# Patient Record
Sex: Male | Born: 1975 | Race: White | Hispanic: Yes | Marital: Single | Smoking: Current every day smoker
Health system: Southern US, Community
[De-identification: ages and names within clinical notes are randomized; demographics above are authoritative.]

---

## 2009-03-05 ENCOUNTER — Emergency Department (HOSPITAL_COMMUNITY): Admission: EM | Admit: 2009-03-05 | Discharge: 2009-03-05 | Payer: Self-pay | Admitting: Emergency Medicine

## 2016-04-27 ENCOUNTER — Ambulatory Visit (INDEPENDENT_AMBULATORY_CARE_PROVIDER_SITE_OTHER): Payer: Self-pay | Admitting: Urgent Care

## 2016-04-27 ENCOUNTER — Ambulatory Visit (INDEPENDENT_AMBULATORY_CARE_PROVIDER_SITE_OTHER): Payer: Self-pay

## 2016-04-27 VITALS — BP 122/72 | HR 79 | Temp 98.1°F | Resp 17 | Ht 68.5 in | Wt 155.0 lb

## 2016-04-27 DIAGNOSIS — M545 Low back pain, unspecified: Secondary | ICD-10-CM

## 2016-04-27 DIAGNOSIS — M546 Pain in thoracic spine: Secondary | ICD-10-CM

## 2016-04-27 DIAGNOSIS — M5137 Other intervertebral disc degeneration, lumbosacral region: Secondary | ICD-10-CM

## 2016-04-27 DIAGNOSIS — M5030 Other cervical disc degeneration, unspecified cervical region: Secondary | ICD-10-CM

## 2016-04-27 DIAGNOSIS — M5136 Other intervertebral disc degeneration, lumbar region: Secondary | ICD-10-CM

## 2016-04-27 DIAGNOSIS — M542 Cervicalgia: Secondary | ICD-10-CM

## 2016-04-27 DIAGNOSIS — Z026 Encounter for examination for insurance purposes: Secondary | ICD-10-CM

## 2016-04-27 MED ORDER — CYCLOBENZAPRINE HCL 5 MG PO TABS: 5.0000 mg | ORAL_TABLET | Freq: Three times a day (TID) | ORAL | 1 refills | Status: AC | PRN

## 2016-04-27 MED ORDER — PREDNISONE 20 MG PO TABS: 40.0000 mg | ORAL_TABLET | Freq: Every day | ORAL | 0 refills | Status: DC

## 2016-04-27 NOTE — Patient Instructions (Addendum)
Discopata degenerativa (Degenerative Disk Disease) La discopata degenerativa es una enfermedad causada por los cambios que se producen en los discos intervertebrales a medida que una persona envejece. Los discos intervertebrales son blandos y comprimibles, y se encuentran entre los huesos de la columna (vrtebras). Estos discos actan como amortiguadores de los [place]. La discopata degenerativa puede comprometer a toda la columna vertebral. Sin embargo, el cuello y parte inferior de la espalda son las zonas ms comnmente afectadas. Al envejecer, pueden producirse muchos cambios en los discos intervertebrales, por ejemplo:  Pueden secarse y encogerse.  Pueden formarse pequeos desgarros en el recubrimiento exterior duro del disco (anillo).  El espacio intervertebral puede reducirse debido a la prdida de [place].  Pueden desarrollarse crecimientos anormales en el hueso (espolones). Esto puede ejercer presin [employer] races nerviosas que salen del canal espinal y Engineer, production.  El canal espinal puede estrecharse. FACTORES DE RIESGO   Tener sobrepeso.  Tener antecedentes familiares de discopata degenerativa.  Fumar.  El riesgo es mayor si suele levantar objetos pesados o sufre una lesin repentina. [place] persona a otra y pueden incluir los siguientes:  Dolor de intensidad variable. Algunas personas no tienen dolor, mientras que otras sufren un dolor intenso. La ubicacin del dolor depende de la zona de la columna vertebral que est afectada.  Si se trata de un disco que se encuentra cerca de la zona del cuello, tendr dolor de cuello o del brazo.  Si la zona afectada es la parte inferior de la espalda, tendr dolor de [place], de los glteos o las piernas.  Dolor que empeora al agacharse, [employer] brazos o Optometrist movimientos de torsin.  Dolor que puede comenzar de [place] gradual y Film/video editor con el paso del [place]. Tambin puede  aparecer despus de sufrir una lesin leve o importante.  Hormigueo o adormecimiento de los brazos o las piernas. DIAGNSTICO  El mdico le preguntar cules son sus sntomas y Estate manager/land agent las actividades y los hbitos que pueden causar Conservation officer, historic buildings. [place], [employer] y las enfermedades que tuvo o los tratamientos que recibi. El [employer] har un examen para determinar el rango de movimiento posible de la zona afectada, controlar la fuerza que tiene en las extremidades, as Therapist, occupational sensibilidad en las zonas de los brazos y las piernas inervadas por diferentes races nerviosas. Tambin se Educational psychologist los siguientes estudios:   Una radiografa de la columna.  Otros estudios por imgenes, como una RM. TRATAMIENTO  El Sport and exercise psychologist cul es el mejor plan de tratamiento. El tratamiento puede incluir lo siguiente:  Medicamentos.  Ejercicios de rehabilitacin. INSTRUCCIONES PARA EL CUIDADO EN EL HOGAR   Siga las tcnicas adecuadas para caminar y Lexicographer objetos pesados como se lo haya recomendado el mdico.  Mantenga una buena [place].  Haga ejercicios regularmente como se lo haya recomendado el [place] de relajacin.  Cambie los hbitos para sentarse, ponerse de pie y dormir como se lo haya recomendado el mdico.  Cambie frecuentemente de posicin.  Mantenga un peso saludable o baje de peso como se lo haya recomendado el mdico.  No consuma ningn producto que contenga tabaco, lo que incluye cigarrillos, tabaco de Higher education careers adviser o Psychologist, sport and exercise. Si necesita ayuda para dejar de fumar, consulte al mdico.  Use calzado que tenga el soporte correcto.  Tome los medicamentos solamente como se lo haya indicado el mdico. SOLICITE ATENCIN MDICA SI:   El dolor no desaparece  en el trmino de 1 a 4semanas.  Pierde drsticamente de peso o el apetito. SOLICITE ATENCIN MDICA DE INMEDIATO SI:   El dolor es intenso.  Tiene Colgatedebilidad en los brazos, las  manos o las piernas.  Comienza a perder el control de la vejiga o los intestinos.  Tiene fiebre o sudores nocturnos. ASEGRESE DE QUE:   Comprende estas instrucciones.  Controlar su afeccin.  Recibir ayuda de inmediato si no mejora o si empeora.   Esta informacin no tiene Theme park managercomo fin reemplazar el consejo del mdico. Asegrese de hacerle al mdico cualquier pregunta que tenga.   Document Released: 10/22/2008 Document Revised: 07/27/2014 Elsevier Interactive Patient Education 2016 [employer]    Dolor de espalda en adultos (Back Pain, Adult) El dolor de espalda es muy frecuente en los adultos.La causa del dolor de espalda es rara vez peligrosa y Chief Technology Officerel dolor a menudo mejora con el [place] Citytiempo.Es posible que se desconozca la causa de esta afeccin. Algunas causas comunes son las siguientes:  Distensin de los msculos o ligamentos que sostienen la columna vertebral.  ChiropractorDesgaste (degeneracin) de los discos vertebrales.  Artritis.  Lesiones directas en la espalda. En [place] personas, el dolor de espalda es recurrente. Como rara vez es peligroso, las personas pueden aprender a Psychologist, clinicalmanejar esta afeccin por s mismas. INSTRUCCIONES PARA EL CUIDADO EN EL HOGAR Controle su dolor de espalda a fin de Public house managerdetectar algn cambio. Las siguientes indicaciones ayudarn a Architectural technologistaliviar cualquier molestia que pueda sentir:  Medical illustratorermanezca activo. Si permanece sentado o de pie en un mismo lugar durante mucho tiempo, se tensiona la espalda. No se siente, conduzca o permanezca de pie en un mismo lugar durante ms de 30 minutos seguidos. Realice caminatas cortas en superficies planas tan pronto como le sea posible.Trate de caminar un poco ms de Pharmacist, communitytiempo cada da.  Haga ejercicio regularmente como se lo haya indicado el mdico. El ejercicio ayuda a que su espalda se cure ms rpidamente. Tambin ayuda a prevenir futuras lesiones al Kimberly-Clarkmantener los msculos fuertes y flexibles.  No permanezca en la cama.Si hace reposo ms de  1 a 2 das, puede demorar su recuperacin.  Preste atencin a su cuerpo al inclinarse y levantarse. Las posiciones ms cmodas son las que ejercen menos tensin en la espalda en recuperacin. Siempre use tcnicas apropiadas para levantar objetos, como por ejemplo:  Flexionar las rodillas.  Mantener la carga cerca del cuerpo.  No torcerse.  Encuentre una posicin cmoda para dormir. Use un colchn firme y recustese de costado con las rodillas ligeramente flexionadas. Si se recuesta Fisher Scientificsobre la espalda, coloque una almohada debajo de las rodillas.  Evite sentir ansiedad o estrs.El estrs aumenta la tensin muscular y puede empeorar el dolor de espalda.Es importante reconocer si se siente ansioso o estresado y aprender maneras de controlarlo, por ejemplo haciendo ejercicio.  Tome los medicamentos solamente como se lo haya indicado el mdico. Los medicamentos de venta libre para Engineer, materialsaliviar el dolor y la inflamacin a menudo son los ms eficaces.El mdico puede recetarle relajantes musculares.Estos medicamentos ayudan a Primary school teachercalmar el dolor de modo que pueda reanudar ms rpidamente sus actividades normales y el ejercicio saludable.  Aplique hielo sobre la zona lesionada.  Ponga el hielo en una bolsa plstica.  Coloque una toalla entre la piel y la bolsa de hielo.  Deje el hielo durante 20minutos, 2 a 3veces por da, durante los primeros 2 o 3das. Despus de eso, puede alternar el hielo y el calor para reducir Chief Technology Officerel dolor y los espasmos.  Mantenga  un peso saludable. El exceso de peso ejerce presin adicional sobre la espalda y hace que resulte difcil mantener una buena [place]. SOLICITE ATENCIN MDICA SI:  Siente un dolor que no se alivia con reposo o medicamentos.  Siente mucho dolor que se extiende a las piernas o los glteos.  El dolor no mejora en una semana.  Siente dolor por la noche.  Pierde peso.  Siente escalofros o fiebre. SOLICITE ATENCIN MDICA DE INMEDIATO SI:   Tiene  nuevos problemas para controlar la vejiga o los intestinos.  Siente debilidad o adormecimiento inusuales en los brazos o en las piernas.  Siente nuseas o vmitos.  Siente dolor abdominal.  Siente que va a desmayarse.   Esta informacin no tiene Theme park manager el consejo del mdico. Asegrese de hacerle al mdico cualquier pregunta que tenga.   Document Released: 07/06/2005 Document Revised: 07/27/2014 Elsevier Interactive Patient Education [employer]     IF you received an x-ray today, you will receive an invoice from [facility]. Please contact [facility] at [phone] with questions or concerns regarding your invoice.   IF you received labwork today, you will receive an invoice from [employer]. Please contact [facility] at [phone] with questions or concerns regarding your invoice.   Our billing staff will not be able to assist you with questions regarding bills from these companies.  You will be contacted with the lab results as soon as they are available. The fastest way to get your results is to activate your My Chart account. Instructions are located on the last page of this paperwork. If you have not heard from [place] regarding the results in 2 weeks, please contact this office.

## 2016-04-27 NOTE — Progress Notes (Addendum)
Patient ID: Kurt Rhodes, male   DOB: 22-Apr-1976, 40 y.o.   MRN: [id number]   By signing my name below, I, Essence Howell, attest that this documentation has been prepared under the direction and in the presence of Wallis BambergMario Mani, PA-C Electronically Signed: Charline BillsEssence Howell, ED Scribe 04/27/2016 at 10:31 AM.  MRN: [id number] DOB: 22-Apr-1976  Subjective:   Kurt Rhodes is a 40 y.o. 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 States that when he tried to lift them, felt immediate sharp pain in his low back, has been radiating to his left leg. He has not been working since 04/22/2016. Has tried naproxen 400mg  BID with some relief. Admits that with resting and naproxen, his pain has been steady but not improving. Denies saddle paraesthesia, hematuria, weakness. He does get some numbness and tingling of his left buttock with prolonged sitting.  Patient's current medications, allergies, pmh, psh were reviewed and not included due to being a worker's comp case.   Objective:   Vitals: BP 122/72 (BP Location: Right Arm, Patient Position: Sitting, Cuff Size: Normal)    Pulse 79    Temp 98.1 F (36.7 C) (Oral)    Resp 17    Ht 5' 8.5" (1.74 m)    Wt 155 lb (70.3 kg)    SpO2 97%    BMI 23.22 kg/m   Physical Exam  Constitutional: He is oriented to person, place, and time. He appears well-developed and well-nourished.  Cardiovascular: Normal rate.   Pulmonary/Chest: Effort normal.  Musculoskeletal:       Cervical back: He exhibits decreased range of motion (flexion, extension). He exhibits no tenderness, no bony tenderness, no swelling, no edema, no deformity, no laceration and no spasm.       Thoracic back: He exhibits tenderness (left paraspinal muscles). He exhibits normal range of motion, no bony tenderness, no swelling, no edema, no deformity and no spasm.       Lumbar back: He exhibits  decreased range of motion (flexion, extension, rotation), tenderness (mid-left sided) and spasm. He exhibits no bony tenderness, no swelling, no edema and no deformity.  Negative straight leg raise.  Neurological: He is alert and oriented to person, place, and time.   Dg Cervical Spine Complete  Result Date: 04/27/2016 CLINICAL DATA:  Neck pain EXAM: CERVICAL SPINE - COMPLETE 4+ VIEW COMPARISON:  None FINDINGS: Straightening of normal cervical lordosis. There is no underlying fracture. Degenerative disc disease is noted at C4-5, C5-6 and C6-7. The facet joints are all well aligned. IMPRESSION: 1. Straightening of normal cervical lordosis is identified which may reflect muscle spasm or patient positioning. 2. Multi level degenerative disc disease. Electronically Signed   By: Signa Kellaylor  Stroud M.D.   On: 04/27/2016 11:46   Dg Thoracic Spine 2 View  Result Date: 04/27/2016 CLINICAL DATA:  Upper back pain EXAM: THORACIC SPINE 2 VIEWS COMPARISON:  None FINDINGS: Normal alignment of the thoracic spine. The vertebral body heights and the disc spaces are well preserved. No fracture or subluxation identified. Mild degenerative disc disease identified. IMPRESSION: 1. No acute findings. 2. Mild degenerative disc disease. Electronically Signed   By: Signa Kellaylor  Stroud M.D.   On: 04/27/2016 11:49   Dg Lumbar Spine Complete  Result Date: 04/27/2016 CLINICAL DATA:  Back pain as a result of lifting breaks at work. Feels sharp pain in lower back lifting while lifting with pain radiating to the  left leg. EXAM: LUMBAR SPINE - COMPLETE 4+ VIEW COMPARISON:  Thoracic spine 04/27/2016 FINDINGS: Minimal retrolisthesis at L4-L5. Disc space narrowing at L5-S1. The vertebral body heights are maintained. No evidence for fracture. No evidence for pars defect. Mild degenerative endplate changes. IMPRESSION: Mild disc space narrowing at L5-S1. Electronically Signed   By: Richarda Overlie M.D.   On: 04/27/2016 11:47     Assessment and Plan :     1. Encounter related to worker's compensation claim 2. Acute left-sided low back pain without sciatica 3. Acute right-sided thoracic back pain 4. Neck pain 5. Degenerative disc disease at L5-S1 level 6. Degenerative disc disease, cervical - Patient likely experienced back sprain while at work, worsened by his multi-level DDD. He is to start short steroid course, Flexeril. Work restrictions provided, rtc in 1 week for recheck.  Wallis Bamberg, PA-C [facility] [phone] 04/27/2016 10:31 AM

## 2016-05-06 ENCOUNTER — Ambulatory Visit (INDEPENDENT_AMBULATORY_CARE_PROVIDER_SITE_OTHER): Payer: Self-pay | Admitting: Physician Assistant

## 2016-05-06 ENCOUNTER — Encounter: Payer: Self-pay | Admitting: Physician Assistant

## 2016-05-06 VITALS — BP 110/70 | HR 77 | Temp 97.9°F | Resp 16 | Ht 68.5 in | Wt 160.2 lb

## 2016-05-06 DIAGNOSIS — Z23 Encounter for immunization: Secondary | ICD-10-CM

## 2016-05-06 DIAGNOSIS — Z042 Encounter for examination and observation following work accident: Secondary | ICD-10-CM

## 2016-05-06 DIAGNOSIS — M545 Low back pain, unspecified: Secondary | ICD-10-CM

## 2016-05-06 NOTE — Patient Instructions (Addendum)
Patient is to perform exercises below at 5 sets with 10 repetitions. Stretches are to be performed for 5 sets, 10 seconds each. Recommended she perform this rehab twice daily within pain tolerance for 2 weeks.  Heat, gentle massage and gentle stretching can help. Remain active, as inactivity can cause more pain. Don't do anything too strenuous though. If you develop new numbness, weakness or incontinence of urine or bowel, return to clinic or go to the emergency room ASAP.  Please return in one week for follow-up.   IF you received an x-ray today, you will receive an invoice from [facility]. Please contact [facility] at [phone] with questions or concerns regarding your invoice.   IF you received labwork today, you will receive an invoice from [employer]. Please contact [facility] at [phone] with questions or concerns regarding your invoice.   Our billing staff will not be able to assist you with questions regarding bills from these companies.  You will be contacted with the lab results as soon as they are available. The fastest way to get your results is to activate your My Chart account. Instructions are located on the last page of this paperwork. If you have not heard from [place] regarding the results in 2 weeks, please contact this office.     Distensin de la cintura con rehabilitacin (Low Back Strain With Rehab) Neomia Dear distensin es una lesin en la que el tendn o msculo se rasga. Los msculos y tendones de la cintura son susceptibles de sufrir distensiones. Sin embargo, estos msculos y tendones son Lynnae Sandhoff fuertes y se requiere de una gran fuerza para lesionarlos. Los esguinces se clasifican en tres categoras. En el esguince de grado 1 hay dolor, pero el tendn no est estirado. En los esguinces de grado 2 hay un ligamento alargado, debido a un estiramiento o desgarro parcial. En el esguince de grado 2 an hay funcionamiento, aunque ste  puede disminuir. Una distensin en grado 3 es la ruptura completa del msculo o el tendn, y suele quedar incapacitada la funcin. SNTOMAS  Dolor en la cintura.  Dolor en la espalda, que a menudo afecta ms a un lado que al [employer]IG[employer].  Dolor que empeora con los movimientos y se siente en las caderas, las nalgas o la zona posterior del muslo.  Espasmos en los msculos de la espalda.  Hinchazn a lo largo de los msculos de la espalda.  Prdida de la fuerza en estos msculos.  Ruido de "crack" (crepitacin) al tocarlos. CAUSAS  La distensin se produce cuando se coloca una fuerza en el msculo o tendn que es mayor de lo que puede soportar. Las causas ms frecuentes de la lesin son:  Benedetto Goad prolongado de la unidad msculo - tendn en la zona de la cintura, generalmente debido a Landscape architect.  Una nica lesin o fuerza violenta aplicada en la espalda. LOS RIESGOS AUMENTAN CON  Cualquier deporte en el que el movimiento ocasione una fuerza de giro en la espalda o una gran inclinacin de la cintura; (ftbol americano, rugby, levantamiento de pesas, bowling, golf, tenis, patinaje, deportes con raqueta, natacin, carrera, gimnasia, clavados).  Poca fuerza y flexibilidad.  No hacer un precalentamiento adecuado.  Historia familiar de dolor de cintura o trastornos en discos.  Cirugas previas en la espalda (especialmente fusin).  Tcnica incorrecta al levantar objetos pesados.  Permanecer largo tiempo sentado, especialmente de manera incorrecta. MEDIDAS DE PREVENCIN  Aprenda y utilice la mecnica correcta al sentarse o al levantarse (  mantener la postura correcta al sentarse; levantar objetos inclinando las rodillas y las piernas y no la cintura).  Precalentamiento adecuado y elongacin antes de la Oakleyactividad.  Descanso y recuperacin entre actividades.  Mantener la forma fsica:  Earma ReadingFuerza, flexibilidad y resistencia muscular.  Capacidad  cardiovascular. PRONSTICO Si se trata adecuadamente, generalmente es curable dentro de las 6 semanas. POSIBLES COMPLICACIONES:  La recurrencia frecuente de los sntomas puede dar como resultado un problema crnico.  La inflamacin crnica, degeneracin cicatrizal del tendn y ruptura parcial del tendn.  Demora de la curacin o de la resolucin de los sntomas.  Discapacidad prolongada. CONSIDERACIONES CSX CorporationENERALES PARA EL TRATAMIENTO El tratamiento inicial incluye el uso de medicamentos y la aplicacin de hielo para reducir Chief Technology Officerel dolor y la inflamacin. Los ejercicios de elongacin y fortalecimiento pueden ayudar a reducir Chief Technology Officerel dolor con la Gothaactividad. Los ejercicios pueden Management consultantrealizarse en el hogar o con un terapeuta. Los Liz Claibornecasos graves pueden requerir la derivacin a un fisioterapeuta para Magazine features editorrealizar una evaluacin y Games developercomenzar un tratamiento, como ultrasonido. El profesional podr indicarle el uso de un dispositivo ortopdico para ayudar a Glass blower/designerreducir el dolor y la inflamacin. A menudo, demasiado reposo en cama podr resultar en ms daos que beneficios. Podrn prescribirle inyecciones de corticoides. Sin embargo, esto deber reservarse para los casos ms graves. Es Therapist, occupationalimportante evitar el uso de la espalda cuando se levantan objetos. Por la noche, se aconseja que usted Djiboutiduerma boca arriba, sobre un [address]col[address] firme y coloque una almohada debajo de las rodillas. Si no se obtiene xito con Artistel tratamiento conservador, ser necesario someterse a [place] and Herz[place]ovinauna ciruga.  MEDICAMENTOS   Si es necesaria la administracin de medicamentos para Chief Technology Officerel dolor, se recomiendan los antiinflamatorios no esteroides, como aspirina e ibuprofeno y otros calmantes menores, como acetaminofeno.  No tome medicamentos para el dolor dentro de los [address] das previos a la Azerbaijanciruga.  El profesional podr prescribirle calmantes si lo considera necesario. Utilcelos como se le indique y slo cuando lo necesite.  Podr beneficiarse con Teachers Insurance and Annuity Associationpomadas sobre la piel.  En  algunos casos se indica una inyeccin de corticosteroides. Estas inyecciones deben reservarse para los New Brendacasos graves, porque slo se pueden administrar una determinada cantidad de veces. CALOR Y FRO   El fro (con hielo) debe aplicarse durante 10 a 15 minutos cada 2  3 horas para reducir la inflamacin y Chief Technology Officerel dolor e inmediatamente despus de cualquier actividad que agrava los sntomas. Utilice bolsas o un masaje de hielo.  El calor puede usarse antes de Therapist, musicelongar y de las actividades de fortalecimiento indicadas por el profesional, le fisioterapeuta o Orthoptistel entrenador. Utilice una bolsa trmica o un pao hmedo. SOLICITE ATENCIN MDICA SI:   Los sntomas empeoran o no mejoran en 2 a 4 semanas, an realizando Pharmacist, communityun tratamiento.  Siente adormecimiento, debilitamiento o prdida de control de la vejiga o el intestino.  Desarrolla nuevos e inexplicables sntomas. (Los medicamentos indicados en el tratamiento le ocasionan efectos secundarios). EJERCICIOS  EJERCICIOS DE AMPLITUD DE MOVIMIENTOS Cherlynn JuneY ELONGACIN Distensin de cintura La mayora de las personas con dolor de espalda baja encuentran que sus sntomas empeoran al doblarse hacia adelante (flexin) o al arquear la regin inferior de la espalda (extensin). Los ejercicios que le ayudarn a Oncologistresolver sus sntomas se Research scientist (life sciences)centrarn en el movimiento contrario.  El mdico, fisioterapeuta o Research scientist (physical sciences)entrenador le ayudarn a Chief Strategy Officerdeterminar qu ejercicios sern de ayuda para resolver su dolor de espalda. No realice ningn ejercicio sin consultarlo antes con el profesional. Discontinue los ejercicios que empeoran sus sntomas, hasta que hable  con el mdico. Si siente dolor, entumecimiento u hormigueo que [employer], piernas o pies, el objetivo de esta terapia es que estos sntomas se acerquen a la espalda y Customer service manager. A veces, estos sntomas en las piernas mejoran, pero el dolor de espalda empeora. Este suele ser un indicio de progreso en su  rehabilitacin. Asegrese de que estar atento a cualquier cambio en sus sntomas y las actividades que ha [employer]	[place] 24 horas antes del cambio. Compartir esta informacin con su mdico le permitir mayor eficiencia para tratar su enfermedad.  Estos ejercicios le ayudarn en la recuperacin de la lesin. Los sntomas podrn aliviarse con o sin asistencia adicional de su mdico, fisioterapeuta o Herbalist. Al completar estos ejercicios, recuerde:  Restaurar la flexibilidad del tejido ayuda a que las articulaciones recuperen el movimiento normal. Esto permite que el movimiento y la actividad sea ms saludables y menos dolorosos.  Para que sea efectiva, cada elongacin debe realizarse durante al menos 30 segundos.  La elongacin nunca debe ser dolorosa. Deber sentir slo un alargamiento o distensin suave del tejido que estira. EJERCICIOS DE AMPLITUD DE MOVIMIENTOS Y ELONGACIN: ELONGACION Flexin - una rodilla al pecho  Recustese en una cama dura o sobre el piso, con ambas piernas extendidas al frente.  Manteniendo una pierna en contacto con el piso, lleve la rodilla opuesta al pecho. Mantenga la pierna en esa posicin, sostenindola por la zona posterior del muslo o por la rodilla.  Presione hasta sentir un suave estiramiento en la cintura. Mantenga esta posicin durante __________ segundos.  Libere la pierna lentamente y repita el ejercicio con el lado opuesto. Reptalo __________ veces. Realice este ejercicio __________ veces por da.  ELONGACIN Flexin, dos rodillas al pecho   Recustese en una cama dura o sobre el piso, con ambas piernas extendidas al frente.  Manteniendo una pierna en contacto con el piso, lleve la rodilla opuesta al pecho.  Tense los msculos del estmago para apoyar la espalda y levante la otra rodilla [place]. Mantenga las piernas en su lugar y tmese por detrs de las caderas o las rodillas.  Con ambas rodillas en el pecho, tire hasta que sienta un  estiramiento en la parte trasera de la espalda. Mantenga esta posicin durante __________ segundos.  Tense los msculos del estmago y baje las piernas de a una por vez. Reptalo __________ veces. Realice este ejercicio __________ veces por da.  ELONGACIN Rotacin de la zona baja del tronco  Recustese sobre una cama firme o sobre el suelo. Mantenga las piernas al frente, doble las rodillas de modo que ambas apunten hacia el techo y los pies queden bien apoyados en el piso.  Extienda los brazos a Dance movement psychotherapist. Esto estabilizar la zona superior del cuerpo, manteniendo los hombros en contacto con el piso.  Suave y lentamente deje caer ambas rodillas juntas hacia un lado, hasta sentir un ligero estiramiento en la cintura. Mantenga esta posicin durante __________ segundos.  Tensione los [employer] Corporation del estmago para Occupational psychologist la cintura mientras lleva las rodillas nuevamente a la posicin inicial. Repita el ejercicio hacia el otro lado. Reptalo __________ veces. Realice este ejercicio __________ veces por da. EJERCICIOS DE AMPLITUD DE MOVIMIENTOS Y FLEXIBILIDAD: ELONGACIN Extensin posicin prona sobre los codos  Acustese sobre el estmago sobre el piso, una cama ser muy blanda. Coloque las palmas a una distancia igual al ancho de los hombres y a la altura de la cabeza.  Coloque los codos bajo los hombros. Si siente dolor,  colquese almohadas debajo del pecho.  Deje que su cuerpo se relaje, de modo que las caderas queden ms abajo y tengan ms contacto con el piso.  Mantenga esta posicin durante __________ segundos.  Vuelva lentamente a la posicin plana sobre el piso. Reptalo __________ veces. Realice este ejercicio __________ veces por da.  AMPLITUD DE MOVIMIENTOS - Extensin - flexin de brazos en posicin prona  Acustese sobre el [employer]	[place] piso, una cama ser [place]. Coloque las palmas a una distancia igual al ancho de los hombres y a la altura de la cabeza.  Mantenga  la espalda tan relajada como pueda, enderece lentamente los codos [address] las caderas contra el suelo. Puede modificar la posicin de las manos para estar ms cmodo. A medida que gana movimiento, sus manos quedarn ms por debajo de los hombros.  Mantenga cada posicin durante __________segundos.  Vuelva lentamente a la posicin plana sobre el piso. Reptalo __________ veces. Realice este ejercicio __________ veces por da.  AMPLITUD DE MOVIMIENTOS Cuadrpedo [place] vertebral neutral  Coloque las manos y las rodillas en una superficie firme. Las manos deben quedar a la altura de los hombros y las rodillas debajo de las caderas. Puede colocar algo debajo las rodillas para estar ms cmodo.  Haga caer la cabeza y apunte el cccix hacia el suelo debajo de usted. De este modo se redondear la cintura, en [place] similar a un gato enojado. Mantenga esta posicin durante __________ segundos.  Lentamente levante la cabeza y afloje el cccix para que se hunda el cuerpo en un gran arco, como un caballo.  Mantenga esta posicin durante __________ segundos.  Reptalo hasta sentir calor en la cintura.  Ahora encuentre su "punto ideal". Ser la posicin ms cmoda [employer]. En esta posicin es cuando su columna est neutral. Una vez que encuentre la posicin, tensione los msculos del estmago para sostener la zona inferior de la espalda.  Mantenga esta posicin durante __________ segundos. Reptalo __________ veces. Realice este ejercicio __________ veces por da.  EJERCICIOS DE FORTALECIMIENTO - Distensin de la cintura Estos ejercicios le ayudarn en la recuperacin de la lesin. Estos ejercicios deben hacerse cerca de su "punto dulce". Este es el arco neutro, de la parte baja de la espalda, en algn lugar entre la posicin completamente redondeada y arqueada plenamente, que es la posicin menos dolorosa. Cuando se realiza en [employer] de seguridad del  movimiento, estos ejercicios se pueden Chemical engineer para las personas que tienen una lesin basada en flexin o extensin. Con estos ejercicios, los sntomas podrn desaparecer con o sin mayor intervencin del profesional, el fisioterapeuta o Orthoptist. Al completar estos ejercicios, recuerde:   Los msculos pueden ganar la resistencia y la fuerza necesarias para las actividades diarias a travs de ejercicios controlados.  Realice los ejercicios como se lo indic el mdico, el fisioterapeuta o Orthoptist. Aumente la resistencia y las repeticiones segn se le haya indicado.  Podr experimentar dolor o cansancio muscular, pero el dolor o molestia que trata de eliminar a travs de los ejercicios nunca debe empeorar. Si el dolor empeora, detngase y asegrese de que est siguiendo las directivas correctamente. Si an siente dolor luego de Education officer, environmental lo ajustes necesarios, deber discontinuar el ejercicio hasta que pueda conversar con el profesional sobre el problema. FORTALECIMIENTO - Abdominales profundos - Inclinacin plvica  Recustese sobre una cama firme o sobre el suelo. Mantenga las piernas al frente, doble las rodillas de modo que ambas apunten [place]  techo y los pies queden bien apoyados en el piso.  Tensione la zona baja de los msculos abdominales para presionar la Oncologist. Este movimiento har rotar su pelvis de modo que el cccix quede hacia arriba y no apuntando a los pies o hacia el piso.  Con una tensin suave y respiracin pareja, mantenga esta posicin durante __________ segundos. Reptalo __________ veces. Realice este ejercicio __________ veces por da.  FORTALECIMIENTO Abdominales encogimiento abdominal  Recustese sobre una cama firme o sobre el suelo. Mantenga las piernas al frente, doble las rodillas de modo que ambas apunten hacia el techo y los pies queden bien apoyados en el piso. Cruce las [employer].  Apunte suavemente con la barbilla hacia  abajo, sin doblar el cuello.  Tensione los abdominales y eleve lentamente el tronco la altura suficiente para despegar los omplatos. Si se eleva ms, pondr tensin excesiva en la cintura y esto no fortalecer ms los abdominales.  Controle la vuelta a la posicin inicial. Reptalo __________ veces. Realice este ejercicio __________ veces por da.  EN CUATRO MIEMBROS Cuadrpedo, elevacin de miembro superior e inferior opuestos   [employer] y las rodillas en una superficie firme. Las manos deben quedar a la altura de los hombros y las rodillas debajo de las caderas. Puede colocar algo debajo las rodillas para estar ms cmodo.  Encuentre la posicin neutral de la columna vertebral y Public house manager los msculos abdominales de modo que pueda mantener esta posicin. Los hombros y las caderas deben formar un rectngulo paralelo con el suelo y recto.  Manteniendo el tronco firme, eleve la mano derecha a la altura del hombro y luego eleve la pierna izquierda a la altura de la cadera. Asegrese de no contener la respiracin. Mantenga cada posicin durante __________ segundos.  Con los msculos abdominales en tensin y la espalda firme, vuelva lentamente a la posicin inicial. Repita con el otro brazo y la otra pierna. Reptalo __________ veces. Realice este ejercicio __________ veces por da.  FORTALECIMIENTO Abdominales bajos Elevacin de ambas rodillas  Recustese sobre una cama firme o sobre el suelo. Mantenga las piernas al frente, doble las rodillas de modo que ambas apunten hacia el techo y los pies queden bien apoyados en el piso.  Tensione los msculos abdominales que se encuentran en la cintura y eleve lentamente ambas rodillas hasta que queden sobre las caderas. Asegrese de no contener la respiracin.  Mantenga esta posicin durante __________ segundos. Usando los abdominales, regrese a la posicin inicial en un modo lento y controlado. Reptalo __________ veces. Realice este  ejercicio __________ veces por da.  CONSIDERACIONES ACERCA DE LA POSTURA Y LA MECNICA DEL CUERPO Ditensin de la cintura Si mantiene una postura correcta cuando se encuentre de pie, sentado o realizando sus actividades, reducir el estrs Teachers Insurance and Annuity Association diferentes tejidos del cuerpo, y Acupuncturist a los tejidos lesionados la posibilidad de curarse y Film/video editor las experiencias dolorosas. A continuacin se indican pautas generales para mejorar la postura- Su mdico o fisioterapeuta le dar instrucciones especficas segn sus necesidades. Al leer estas pautas recuerde:  Los ejercicios indicados por su mdico lo ayudarn a Chartered certified accountant flexibilidad y la fuerza para Pharmacologist las posturas correctas.  La postura correcta proporciona el mejor entorno de trabajo para las articulaciones. Las articulaciones se desgastan menos cuando estn sostenidas adecuadamente por una columna vertebral en buena postura. Esto significa que su cuerpo estar ms sano y Research officer, trade union.  La correcta postura debe practicarse en todas las [place],  especialmente al estar sentado o de pie durante mucho tiempo. Tambin es importante al realizar actividades repetitivas de bajo estrs (tipeo) o una actividad nica y pesada. POSICIONES DE Dellie Catholic Tenga en cuenta cules son las posturas que ms dolor le causan al elegir una posicin de descanso. Si siente dolor con las actividades en que deba realizar una flexin (sentarse, inclinarse, detenerse, ponerse en cuclillas), elija una posicin que le permita descansar en una postura menos flexionada. Evite curvarse en posicin fetal cuando se encuentre de lado. Si el dolor empeora con las actividades basadas en la extensin (estar de pie durante un tiempo prolongado, trabajar con las manos por arriba de la cabeza) evite descansar en Neomia Dear posicin extendida durante mucho tiempo, como dormir sobre el [place]. La [employer] de las Psychologist, forensic cmodo el descanso sobre la columna vertebral en una posicin  neutral, ni muy redondeada ni [place]. Recustese sobre su lado en una cama que no est hundida con una almohada entre las rodillas o sobre la espalda con una almohada bajo las rodillas, y sentir [place]. Tenga en cuenta que cualquier posicin en [employer], no importa si es una postura [place], puede provocarle rigidez. POSTURAS CORRECTAS PARA SENTARSE Con el fin de minimizar el estrs y Environmental health practitioner en su columna, deber sentarse con la postura correcta. Sentarse con una buena postura debe ser algo sin esfuerzo para un cuerpo sano. Recuperar una buena postura es un proceso gradual. Muchas personas pueden trabajar ms cmodas mediante el uso de diferentes soportes hasta que tengan la flexibilidad y la fuerza para mantener esta postura por su cuenta. Al sentarse con la [employer], los odos deben estar sobre los hombros y los hombros sobre las caderas. Debe utilizar el respaldo de la silla para apoyar la espalda. La espalda estar en una posicin neutral, ligeramente arqueada. Puede colocar una pequea almohada o toalla doblada en la base de la espalda baja para apoyo.  Si trabaja en un escritorio, cree un ambiente que le proporciones un buen soporte y [place]. Sin apoyo adicional, msculos se cansan, lo que lleva a una tensin excesiva en las articulaciones y otros tejidos. Tenga en cuenta estas recomendaciones: SILLA:   La silla debe poder deslizarse por debajo del escritorio cuando su espalda tome contacto con el respaldo. Esto le permitir trabajar ms cerca.  La altura de la silla debe permitirle que los ojos tengan el nivel de la parte superior del monitor y las manos estn ms abajo que los codos. POSICIN DEL CUERPO  Los pies deben tener contacto con el piso. Si no es posible, use un posapies.  Mantenga las [employer] hombros. Esto reducir el estrs en el cuello y en la cintura. POSTURAS INCORRECTAS PARA SENTARSE  Si se siente cansado e incapaz de  asumir una postura sentada sana, no se eche hacia atrs. Esto pone una tensin excesiva en los tejidos de su espalda, y causa ms dao y Engineer, mining. Entre las opciones ms saludables se incluyen:  El uso de ms apoyo, como una almohada lumbar.  Cambio de tareas, a algo que demande una posicin vertical o caminar.  Tomar una breve caminata.  Recostarse y Theatre stage manager posicin neutral. DE PIE DURANTE UN TIEMPO PROLONGADO E INCLINADO LIGERAMENTE HACIA ADELANTE Cuando deba realizar una tarea que requiera inclinacin hacia adelante estando de pie en el mismo sitio durante mucho tiempo, coloque un pie en un objeto de 2 a 4 pulgadas de alto, para [employer]. Cuando ambos pies  estn en el piso, la zona inferior de la espalda tiene a perder su ligera curvatura hacia adentro. Si esta curva se aplana (o se pronuncia demasiado) la espalda y las articulaciones experimentarn demasiado estrs, se fatigarn ms rpidamente y Geophysicist/field seismologist. POSTURAS CORRECTAS PARA ESTAR DE PIE Una postura adecuada de pie realizarse en todas las actividades diarias, incluso si slo toman un momento, como al [employer]. Como en la postura de sentado, los odos deben estar sobre los hombros y los hombros sobre las caderas. Deber mantener una ligera tensin en sus msculos abdominales para asegurar la columna vertebral. El cccix debe apuntar hacia el suelo, no detrs de su cuerpo, ya que resultara en una curvatura de la espalda sobre-extendida.  POSTURAS INCORRECTAS PARA ESTAR DE PIE Las posturas incorrectas para estar de pie incluyen tener la cabeza hacia delante, las rodillas bloqueadas o una excesiva curvatura de la espalda. CAMINAR [place] en Deborha Payment erguida. Las [place], hombros y caderas deben estar alineados. ACTIVIDAD PROLONGADA EN UNA POSICIN FLEXIONADA Al completar una tarea que requiere que se doble la cintura hacia adelante o inclinarse sobre una superficie baja, trate de encontrar una  manera de estabilizar 3 de cada 4 de sus miembros. Puede colocar una mano o el codo en el [place], o descansar una rodilla en la superficie en la que est apoyado. Esto le proporcionar ms estabilidad para que sus msculos no se cansen tan rpidamente. El [employer] Corporation rodillas [place], o ligeramente dobladas, tambin reducir el estrs en la espalda baja. TCNICAS CORRECTAS PARA LEVANTAR OBJETOS SI:   Asumir una postura amplia. Esto le proporcionar ms estabilidad y la oportunidad de acercarse lo ms posible al objeto que se est levantando.  Tense los abdominales para asegurar la columna vertebral. Doble las rodillas y las caderas. Manteniendo la espalda en una posicin neutral, haga el esfuerzo con los msculos de la pierna. Levntese con las piernas, manteniendo la espalda derecha.  Pruebe el peso de los objetos desconocidos antes de tratar de Secondary school teacher.  Trate de [employer] codos hacia abajo y a los lados, con el fin de obtener la fuerza de los hombros al llevar un objeto.  Siempre pida ayuda a otra persona cuando deba levantar objetos pesados o incmodos. TCNICAS INCORRECTAS PARA LEVANTAR OBJETOS NO:   Bloquee rodillas al levantar, aunque sea un objeto pequeo.  Se doble ni gire. Gire sobre los pies ni los mueva cuando necesite cambiar de direccin.  Considere que no puede levantar incluso un clip de papel con seguridad, sin Clinical biochemist.   Esta informacin no tiene Theme park manager el consejo del mdico. Asegrese de hacerle al mdico cualquier pregunta que tenga.   Document Released: 04/22/2006 Document Revised: 11/20/2014 Elsevier Interactive Patient Education [employer]

## 2016-05-06 NOTE — Progress Notes (Signed)
    MRN: [id number] DOB: Jul 27, 1975  Subjective:   Kurt Rhodes is a 40 y.o. male presenting for follow up of low back pain. Mobile interpretation used today Alejandra [id number].  He sustained his injury at work while lifting bricks, he felt a sharp pain in his low back with radiating pain to his left leg.  He was here eight days ago. X-rays at that visit showed DDD at his c-spine as well as L5-S1. He was treated with a course of steroids and flexeril. Is taking flexeril three times a day and Naproxen as directed.  Has finished his course of steroids, was taking as prescribed only at breakfast.  Work restrictions for one week were written for patient. He reports he has been on restriction with light duty.  He is a Corporate investment bankerconstruction worker.   Feels some relief today, reports medications helped some. He is able to perform light work duties with minor pain. Pain worsens with bending forward repetitavely.  Still experiencing pain in low back and occasionally radiates down his left leg. He notes he still cannot bend over a lot or lift heavy things.   Patient's past medical history, medications and allergies were reviewed with patient.   Objective:   Vitals: BP 110/70   Pulse 77   Temp 97.9 F (36.6 C) (Oral)   Resp 16   Ht 5' 8.5" (1.74 m)   Wt 160 lb 3.2 oz (72.7 kg)   SpO2 98%   BMI 24.00 kg/m   Physical Exam  Constitutional: He is oriented to person, place, and time. He appears well-developed and well-nourished. No distress.  Cardiovascular: Normal rate, normal heart sounds and intact distal pulses.   Pulmonary/Chest: Effort normal. No respiratory distress.  Musculoskeletal:       Thoracic back: He exhibits tenderness (palpation of paraspinous muslces. ).       Lumbar back: He exhibits tenderness (palpation b/l paraspinous muscles. ) and pain (flexion, extension, twisting toward left). He exhibits normal range of motion and no deformity.  Neg SLR  Neurological: He is alert and  oriented to person, place, and time. He has normal reflexes.  Skin: He is not diaphoretic.    No results found for this or any previous visit (from the past 24 hour(s)).  Assessment and Plan :  1. Encounter for examination and observation following work accident 2. Left-sided low back pain without sciatica, unspecified chronicity - Patient likely experienced back pain on the job. Advised patient to continue taking Flexeril and Naproxen as directed. Stretches discussed and printed off for patient. Encouraged to stretch and exercise daily to help with pain. He will RTC in one week. Work note written for one more week of light duty.   Marco CollieWhitney Jurgen Groeneveld, PA-C  [facility] 05/06/2016 8:28 AM

## 2017-10-27 IMAGING — DX DG THORACIC SPINE 2V
2 series · 2 of 2 positions shown · non-contrast
Comparison: None

CLINICAL DATA: Upper back pain

EXAM:
THORACIC SPINE 2 VIEWS

[t-spine ap]
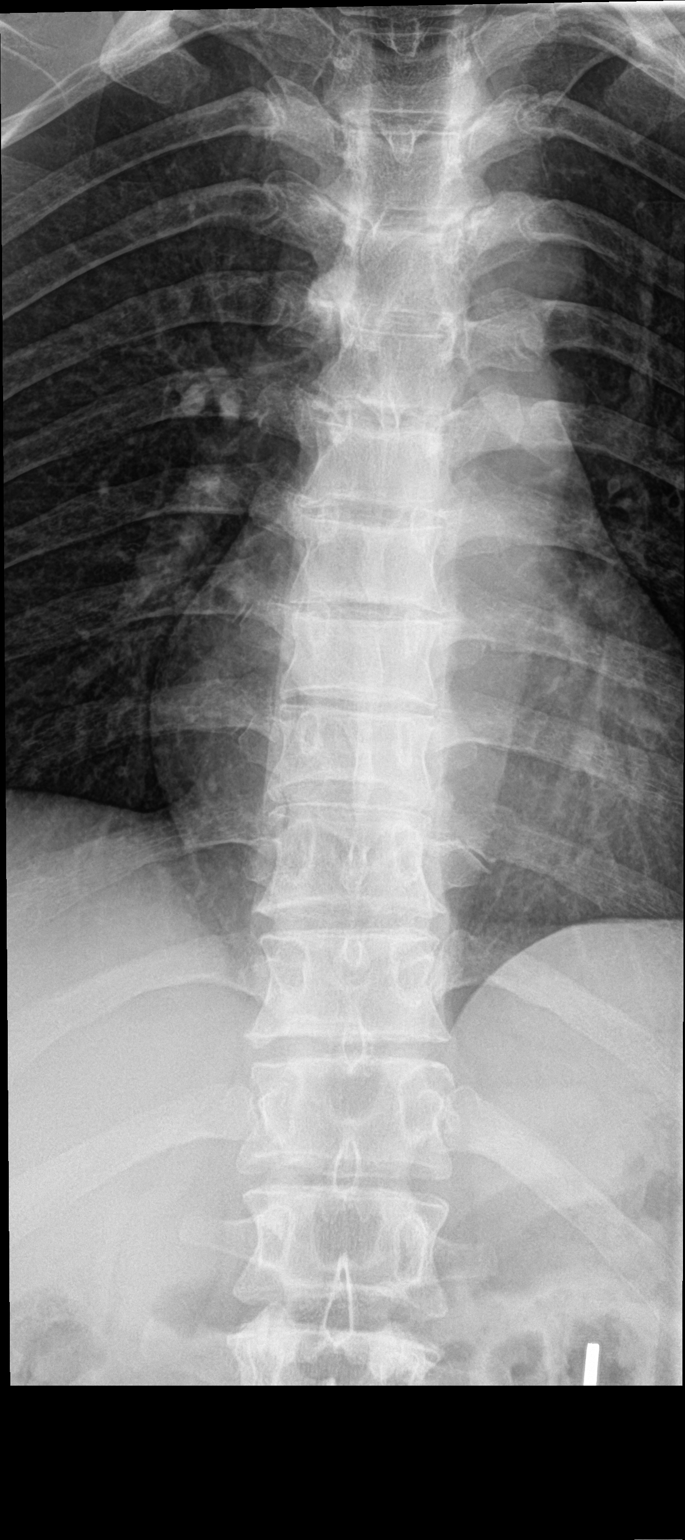

[t-spine lat]
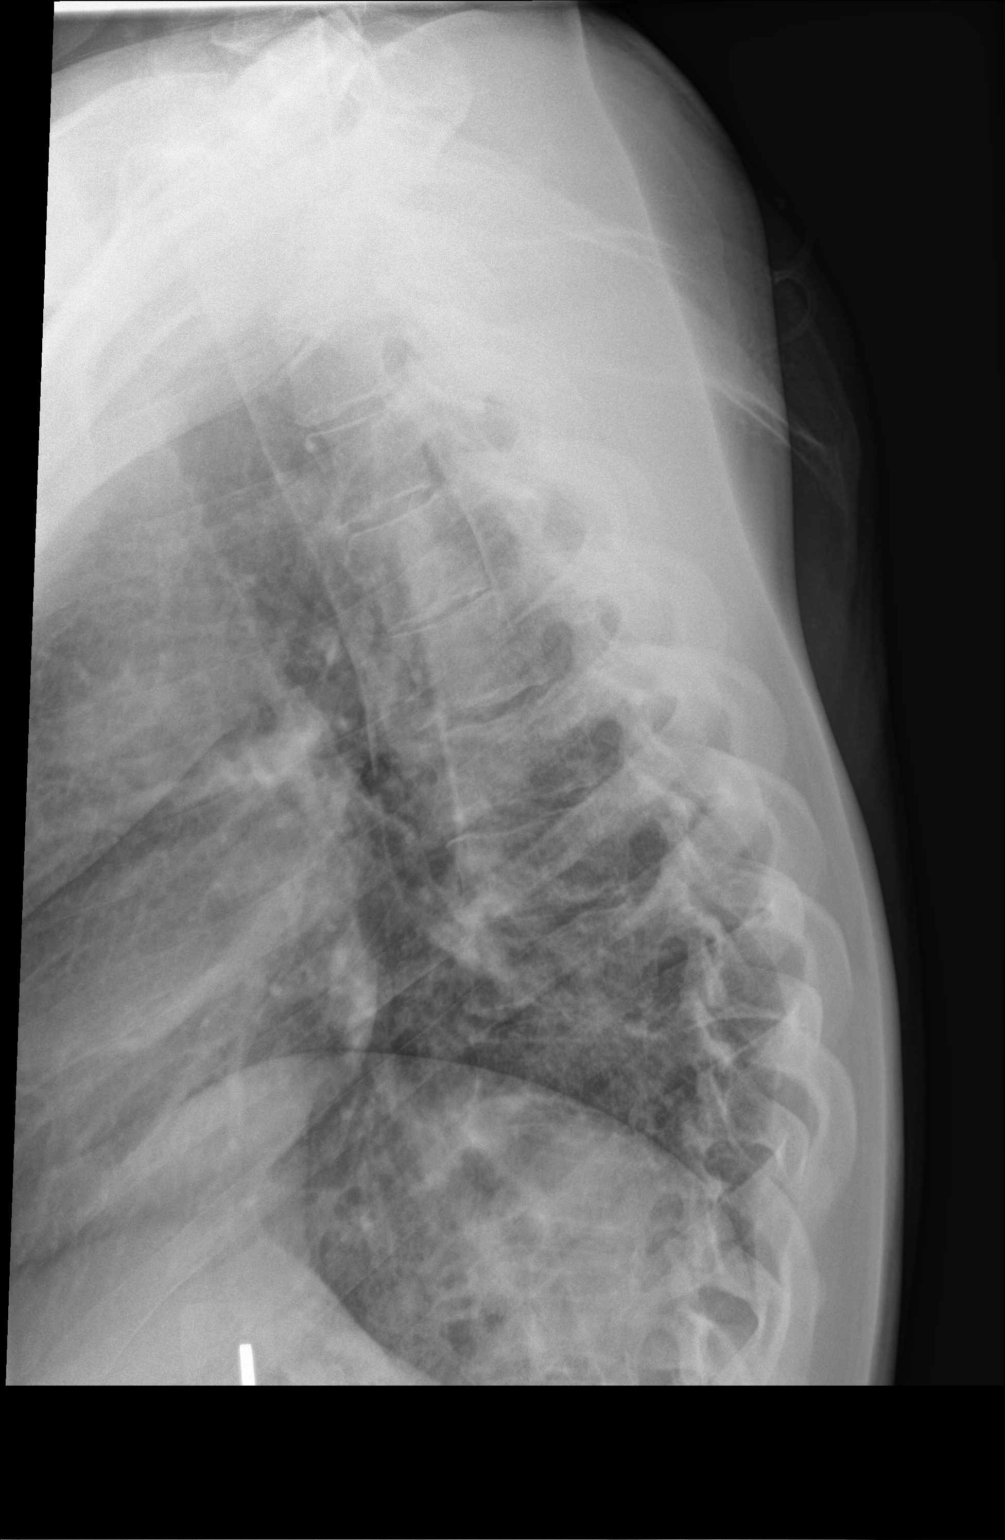

[2 of 2 positions shown; findings below may reference images not displayed]

FINDINGS: Normal alignment of the thoracic spine. The vertebral body heights
and the disc spaces are well preserved. No fracture or subluxation
identified. Mild degenerative disc disease identified.
IMPRESSION: 1. No acute findings.
2. Mild degenerative disc disease.

## 2020-08-26 ENCOUNTER — Emergency Department (HOSPITAL_COMMUNITY): Payer: Self-pay

## 2020-08-26 ENCOUNTER — Other Ambulatory Visit: Payer: Self-pay

## 2020-08-26 ENCOUNTER — Emergency Department (HOSPITAL_COMMUNITY)
Admission: EM | Admit: 2020-08-26 | Discharge: 2020-08-27 | Disposition: A | Discharge status: Home / Self Care | Payer: Self-pay | Attending: Emergency Medicine | Admitting: Emergency Medicine

## 2020-08-26 ENCOUNTER — Encounter (HOSPITAL_COMMUNITY): Payer: Self-pay | Admitting: *Deleted

## 2020-08-26 DIAGNOSIS — N50811 Right testicular pain: Secondary | ICD-10-CM

## 2020-08-26 DIAGNOSIS — N453 Epididymo-orchitis: Secondary | ICD-10-CM | POA: Insufficient documentation

## 2020-08-26 DIAGNOSIS — F17200 Nicotine dependence, unspecified, uncomplicated: Secondary | ICD-10-CM | POA: Insufficient documentation

## 2020-08-26 LAB — URINALYSIS, ROUTINE W REFLEX MICROSCOPIC
Bilirubin Urine: NEGATIVE
Glucose, UA: NEGATIVE mg/dL
Hgb urine dipstick: NEGATIVE
Ketones, ur: NEGATIVE mg/dL
Leukocytes,Ua: NEGATIVE
Nitrite: NEGATIVE
Protein, ur: NEGATIVE mg/dL
Specific Gravity, Urine: 1.025 (ref 1.005–1.030)
pH: 5 (ref 5.0–8.0)

## 2020-08-26 MED ORDER — OXYCODONE-ACETAMINOPHEN 5-325 MG PO TABS
1.0000 | ORAL_TABLET | Freq: Once | ORAL | Status: AC
  Administered 2020-08-27: 1 via ORAL
  Filled 2020-08-26: qty 1

## 2020-08-26 NOTE — ED Notes (Signed)
ED Provider at bedside. 

## 2020-08-26 NOTE — ED Triage Notes (Signed)
Via spanish interpreter the pt says that he has inflammation in his  right testicle, last week he was helping move a deck and it started to fall and he carried all the weight, since then has had pain and swelling in the right testicle. Pain with urination. Taking tylenol without relief.

## 2020-08-26 NOTE — ED Provider Notes (Signed)
MSE was initiated and I personally evaluated the patient and placed orders (if any) at  11:06 PM on August 26, 2020.  Patient is a 45 year old male with no pertinent past medical history presented today with testicular pain and swelling he states his symptoms been ongoing for approximately 1 week.  He states that he seems to 50 mL right testicle.  He states that he was doing some heavy lifting and caught himself mid fall 1 week ago and states that he noticed some pain at that time which has been constant and slowly worsening since.  He states that he also has burning pain of the tip of his penis with urination.  He states he is only sexually active with one partner who is monogamous with him.  He denies any penile discharge.  He denies any penile pain.  He denies any urinary frequency or hematuria.  Also denies any fevers, chills, nausea, abdominal pain, vomiting.  CONSTITUTIONAL:  well-appearing, NAD NEURO:  Alert and oriented x 3, no focal deficits EYES:  pupils equal and reactive ENT/NECK:  trachea midline, no JVD CARDIO:  reg rate, reg rhythm, well-perfused PULM:  None labored breathing GI/GU:  Abdomen non-distended, no tenderness to palpation, abdomen is soft, no CVA tenderness.  Testicular exam is notable for significantly swollen right testicle.  No borborygmi or sounds with peristalsis with auscultation of the scrotum.  There is significant tenderness to palpation.  There is no penile discharge or swelling of the penis.  No phimosis or paraphimosis. MSK/SPINE:  No gross deformities, no edema SKIN:  no rash obvious, atraumatic, no ecchymosis  PSYCH:  Appropriate speech and behavior   Differential includes hernia versus epididymitis versus testicular mass. Suspect epididymitis although exertional component concerning for hernia.  Testicular ultrasound ordered along with urinalysis, GC chlamydia urinalysis  The patient appears stable so that the remainder of the MSE may be completed by  another provider.   Solon Augusta Gorman, [place] 08/26/20 2310    Shon Baton, MD 08/27/20 [phone]

## 2020-08-27 LAB — GC/CHLAMYDIA PROBE AMP (~~LOC~~) NOT AT ARMC
Chlamydia: NEGATIVE
Comment: NEGATIVE
Comment: NORMAL
Neisseria Gonorrhea: NEGATIVE

## 2020-08-27 MED ORDER — CEFTRIAXONE SODIUM 500 MG IJ SOLR
500.0000 mg | Freq: Once | INTRAMUSCULAR | Status: AC
  Administered 2020-08-27: 500 mg via INTRAMUSCULAR
  Filled 2020-08-27: qty 500

## 2020-08-27 MED ORDER — DOXYCYCLINE HYCLATE 100 MG PO CAPS: 100.0000 mg | ORAL_CAPSULE | Freq: Two times a day (BID) | ORAL | 0 refills | Status: AC

## 2020-08-27 MED ORDER — DOXYCYCLINE HYCLATE 100 MG PO TABS
100.0000 mg | ORAL_TABLET | Freq: Once | ORAL | Status: AC
  Administered 2020-08-27: 100 mg via ORAL
  Filled 2020-08-27: qty 1

## 2020-08-27 MED ORDER — STERILE WATER FOR INJECTION IJ SOLN
INTRAMUSCULAR | Status: AC
  Filled 2020-08-27: qty 10

## 2020-08-27 NOTE — ED Provider Notes (Signed)
[facility] EMERGENCY DEPARTMENT Provider Note   CSN: [id number] Arrival date & time: 08/26/20  2236     History Chief Complaint  Patient presents with  . Groin Swelling    Kurt Rhodes is a 45 y.o. male.  HPI Patient is a 45 year old male patient is presented today with 1 week of right testicle pain.  He states it began while he was moving heavy objects and slipped and caught himself.  He states that he did not have any trauma to the testicle however.  He states that his been constant progressively worsening right testicular pain.  He denies any associated fevers, chills. He does endorse associated dysuria.  No hematuria.  No frequency or urgency.  He states he is sexually monogamous with a male partner.  Associate symptoms.  No mitigating or aggravating factors.  No medications prior to arrival     History reviewed. No pertinent past medical history.  There are no problems to display for this patient.   History reviewed. No pertinent surgical history.     No family history on file.  Social History   Tobacco Use  . Smoking status: Current Every Day Smoker    Packs/day: 0.25    Years: 25.00    Pack years: 6.25  . Smokeless tobacco: Never Used  Substance Use Topics  . Alcohol use: No  . Drug use: No    Home Medications Prior to Admission medications   Medication Sig Start Date End Date Taking? Authorizing Provider  doxycycline (VIBRAMYCIN) 100 MG capsule Take 1 capsule (100 mg total) by mouth 2 (two) times daily. 08/27/20  Yes Jozy Mcphearson S, PA  cyclobenzaprine (FLEXERIL) 5 MG tablet Take 1 tablet (5 mg total) by mouth 3 (three) times daily as needed. 04/27/16   Wallis Bamberg, PA-C  naproxen (NAPROSYN) 250 MG tablet Take by mouth 2 (two) times daily with a meal.    [provider]    Allergies    Patient has no known allergies.  Review of Systems   Review of Systems  Constitutional: Negative for chills and fever.   HENT: Negative for congestion.   Eyes: Negative for pain.  Respiratory: Negative for cough and shortness of breath.   Cardiovascular: Negative for chest pain and leg swelling.  Gastrointestinal: Negative for abdominal pain and vomiting.  Genitourinary: Positive for scrotal swelling and testicular pain. Negative for dysuria and penile swelling.  Musculoskeletal: Negative for myalgias.  Skin: Negative for rash.  Neurological: Negative for dizziness and headaches.    Physical Exam Updated Vital Signs BP 96/65   Pulse 95   Temp 98.6 F (37 C) (Oral)   Resp 16   SpO2 100%   Physical Exam Vitals and nursing note reviewed.  Constitutional:      General: He is not in acute distress. HENT:     Head: Normocephalic and atraumatic.     Nose: Nose normal.  Eyes:     General: No scleral icterus. Cardiovascular:     Rate and Rhythm: Normal rate and regular rhythm.     Pulses: Normal pulses.     Heart sounds: Normal heart sounds.  Pulmonary:     Effort: Pulmonary effort is normal. No respiratory distress.     Breath sounds: No wheezing.  Abdominal:     Palpations: Abdomen is soft.     Tenderness: There is no abdominal tenderness. There is no right CVA tenderness, left CVA tenderness, guarding or rebound.  Genitourinary:    Comments: Right  testicle is swollen and firm to palpation.  Some relief from pain with elevation of the testes. Cremasteric reflexes intact.  No penile discharge.  No penile or genital lesions Musculoskeletal:     Cervical back: Normal range of motion.     Right lower leg: No edema.     Left lower leg: No edema.  Skin:    General: Skin is warm and dry.     Capillary Refill: Capillary refill takes less than 2 seconds.  Neurological:     Mental Status: He is alert. Mental status is at baseline.  Psychiatric:        Mood and Affect: Mood normal.        Behavior: Behavior normal.     ED Results / Procedures / Treatments   Labs (all labs ordered are listed,  but only abnormal results are displayed) Labs Reviewed  URINALYSIS, ROUTINE W REFLEX MICROSCOPIC - Abnormal; Notable for the following components:      Result Value   APPearance CLOUDY (*)    All other components within normal limits  URINE CULTURE  GC/CHLAMYDIA PROBE AMP ([place]) NOT AT [facility]    EKG None  Radiology US SCROTUM W/DOPPLER  Result Date: 08/27/2020 CLINICAL DATA:  Severe scrotal swelling, pain EXAM: SCROTAL ULTRASOUND DOPPLER ULTRASOUND OF THE TESTICLES TECHNIQUE: Complete ultrasound examination of the testicles, epididymis, and other scrotal structures was performed. Color and spectral Doppler ultrasound were also utilized to evaluate blood flow to the testicles. COMPARISON:  None. FINDINGS: Right testicle Measurements: 4.3 x 2.7 x 2.8 cm. Hyperemic right testicle. No mass or microlithiasis. Left testicle Measurements: 4.3 x 2.9 x 2.4 cm. No mass or microlithiasis visualized. Right epididymis:  Enlarged and hyperemic. Left epididymis:  Normal in size and appearance. Hydrocele: Large complex septated right hydrocele. Small to moderate complex left hydrocele. Varicocele:  None visualized. Pulsed Doppler interrogation of both testes demonstrates normal low resistance arterial and venous waveforms bilaterally. Marked scrotal wall thickening on the right. IMPRESSION: Hyperemic right testicle. Hyperemic and enlarged right epididymis. Findings compatible with epididymo-orchitis. Complex bilateral hydroceles, right larger than left. Scrotal wall thickening/edema. No evidence of testicular torsion. Electronically Signed   By: Charlett Nose M.D.   On: 08/27/2020 00:17    Procedures Procedures   Medications Ordered in ED Medications  oxyCODONE-acetaminophen (PERCOCET/ROXICET) 5-325 MG per tablet 1 tablet (has no administration in time range)  cefTRIAXone (ROCEPHIN) injection 500 mg (has no administration in time range)  doxycycline (VIBRA-TABS) tablet 100 mg (has no administration in  time range)    ED Course  I have reviewed the triage vital signs and the nursing notes.  Pertinent labs & imaging results that were available during my care of the patient were reviewed by me and considered in my medical decision making (see chart for details).    MDM Rules/Calculators/A&P                          Patient is 45 year old male presented today with right testicle pain. Given that his symptoms began during exertion I have some concern for hernia however do not hear any borborygmi or any other abnormal sounds with auscultation of the scrotum.  Transillumination without any evidence of obvious hydrocele.  Given chronicity of the disease I have low suspicion for this being torsion. Physical exam is notable for right testicular swelling. Patient states that he is sexually monogamous with only his male partner. He denies any penile discharge. Low suspicion for  enteric organism causing patient's epididymitis today.  No indication for Levaquin or ciprofloxacin.  Urinalysis without any evidence of infection.  Urine culture and gonorrhea chlamydia pending.  Ultrasound is consistent with epididymitis/orchitis. IMPRESSION:  Hyperemic right testicle. Hyperemic and enlarged right epididymis.  Findings compatible with epididymo-orchitis.    Complex bilateral hydroceles, right larger than left.    Scrotal wall thickening/edema.    No evidence of testicular torsion.   Specialty which interpreter was used to discuss results. Patient states that he needs to follow-up with urology.  He was given the information for urology and told to call today once their office opens approximately 9 AM.  He also started on doxycycline for 10 days he is understanding of need to take this for completion.  He also was given Rocephin IM.  He will use supportive underwear, cool compresses, Tylenol ibuprofen for pain.  Final Clinical Impression(s) / ED Diagnoses Final diagnoses:   Epididymo-orchitis  Right testicular pain    Rx / DC Orders ED Discharge Orders         Ordered    doxycycline (VIBRAMYCIN) 100 MG capsule  2 times daily        08/27/20 0513           Gailen Shelter, PA 08/27/20 3151    Shon Baton, MD 08/29/20 1038

## 2020-08-27 NOTE — Discharge Instructions (Addendum)
Your ultrasound showed epididymitis/orchitis.  This is an infection of your testicle.  You will need a shot of antibiotics and to take an antibiotic called doxycycline for 10 days.  Please take the entire course of antibiotics even if you feel better.  You must follow-up with urology.  I given you the information for one of our urologists.  Please call today to make an appointment.  Wear supportive underwear (to hold your scrotum) Cool compresses for pain.  Please use Tylenol or ibuprofen for pain.  You may use 600 mg ibuprofen every 6 hours or 1000 mg of Tylenol every 6 hours.  You may choose to alternate between the 2.  This would be most effective.  Not to exceed 4 g of Tylenol within 24 hours.  Not to exceed 3200 mg ibuprofen 24 hours.

## 2020-08-28 LAB — URINE CULTURE: Culture: NO GROWTH

## 2022-02-25 IMAGING — US US SCROTUM W/ DOPPLER COMPLETE
1 series · 13 of 25 positions shown · non-contrast
Comparison: None.

CLINICAL DATA: Severe scrotal swelling, pain

EXAM:
SCROTAL ULTRASOUND
DOPPLER ULTRASOUND OF THE TESTICLES
TECHNIQUE: Complete ultrasound examination of the testicles, epididymis, and
other scrotal structures was performed. Color and spectral Doppler
ultrasound were also utilized to evaluate blood flow to the
testicles.

[Series 1: us scrotum w/doppler · 13 of 100 slices shown]
[im 1/100]
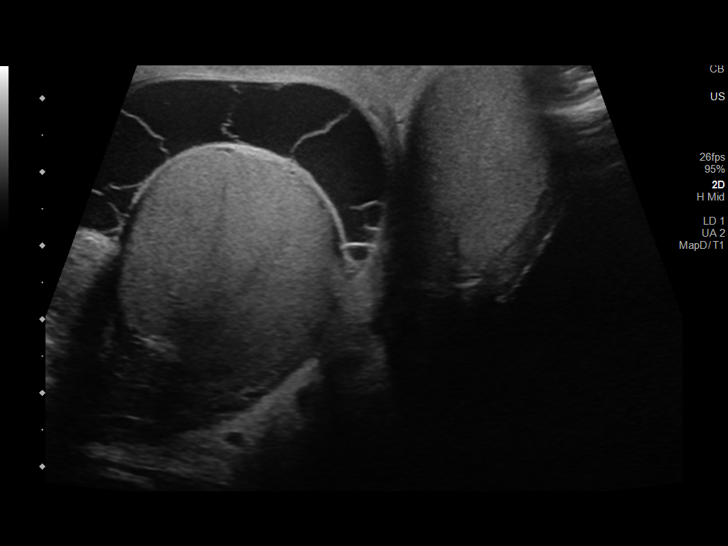
[im 9/100]
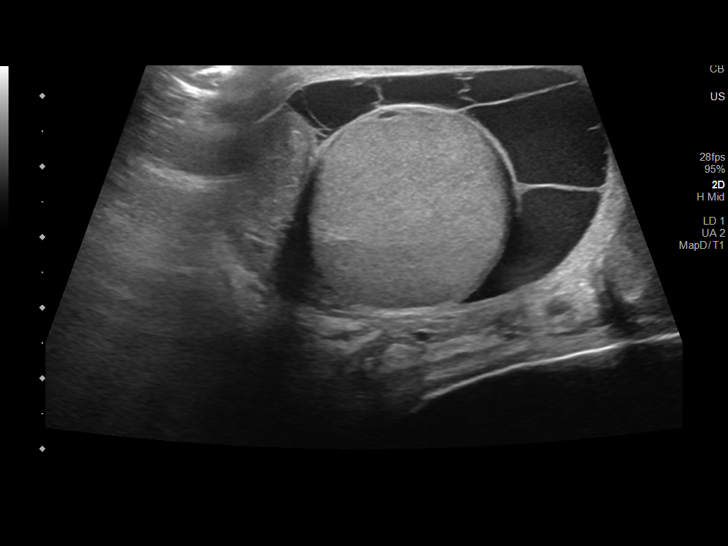
[im 17/100]
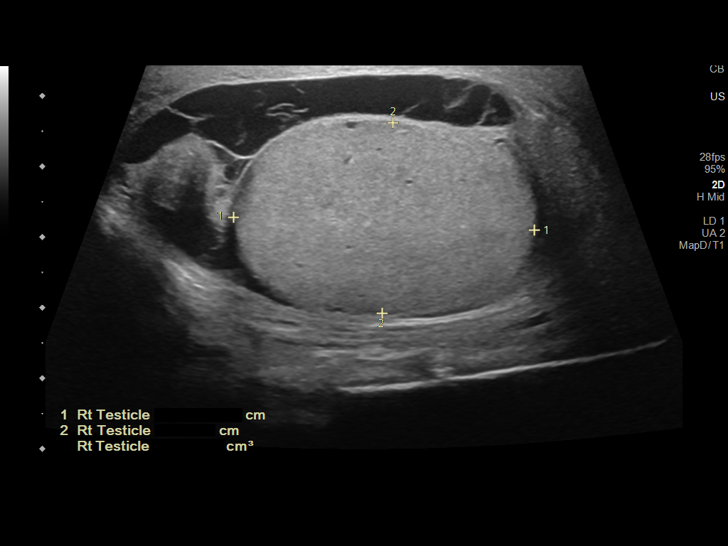
[im 25/100]
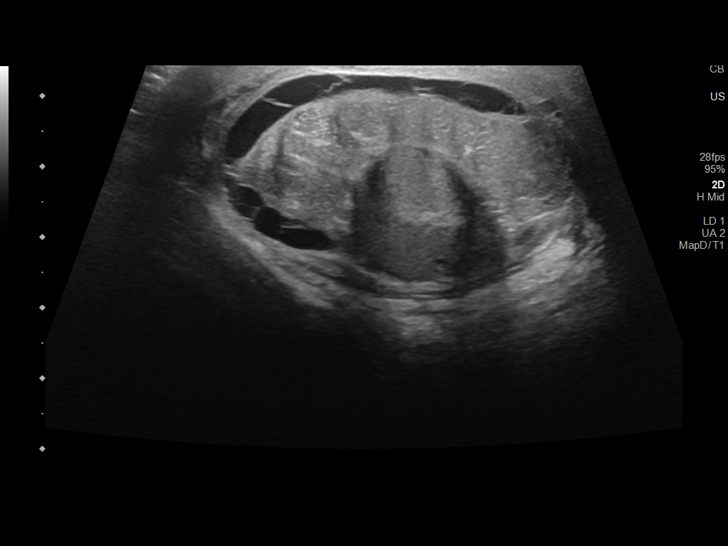
[im 34/100]
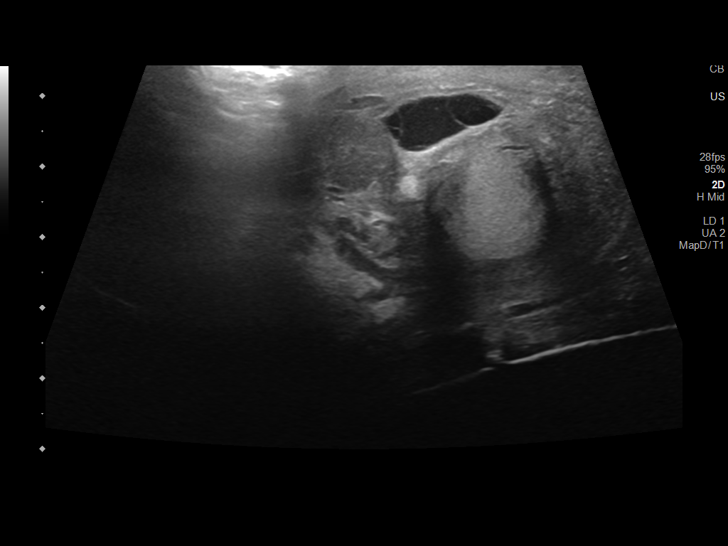
[im 42/100]
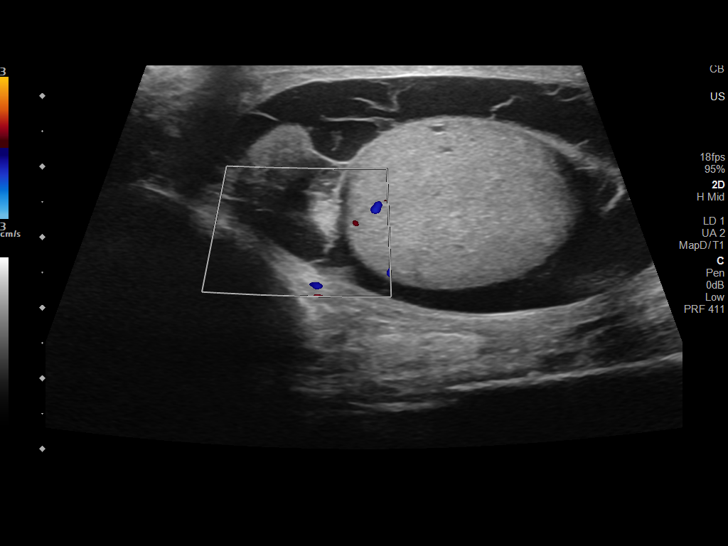
[im 50/100]
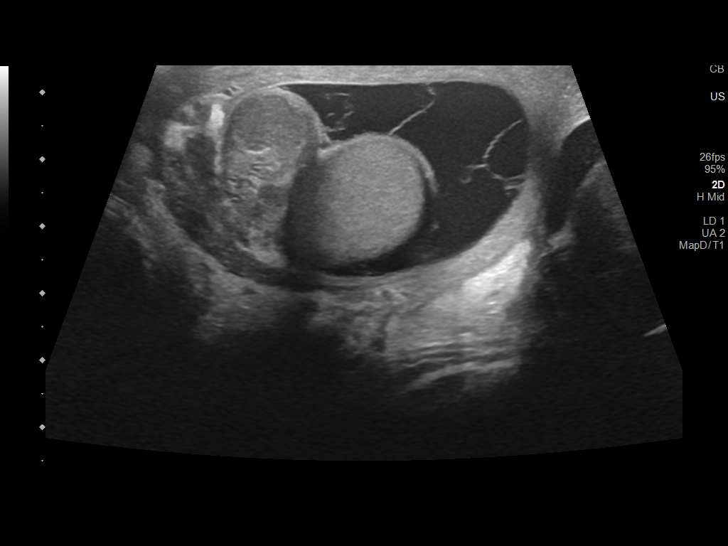
[im 58/100]
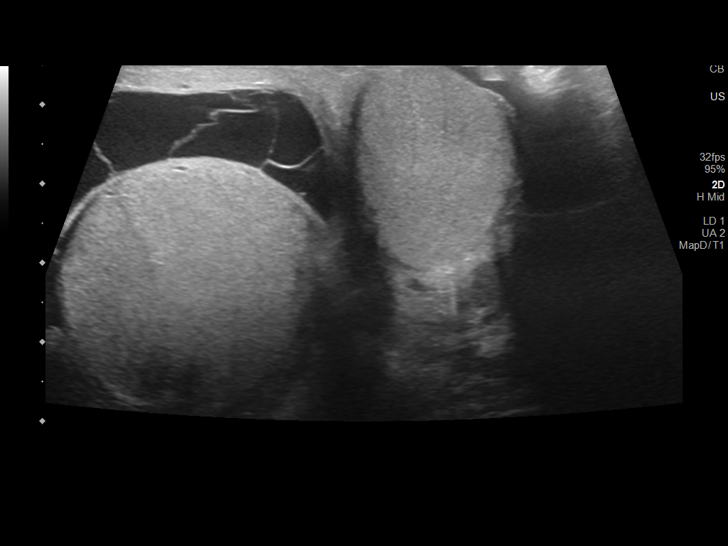
[im 67/100]
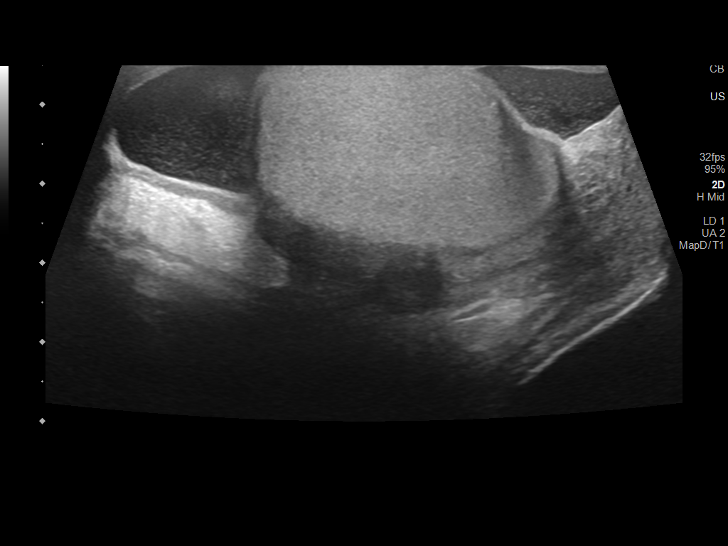
[im 75/100]
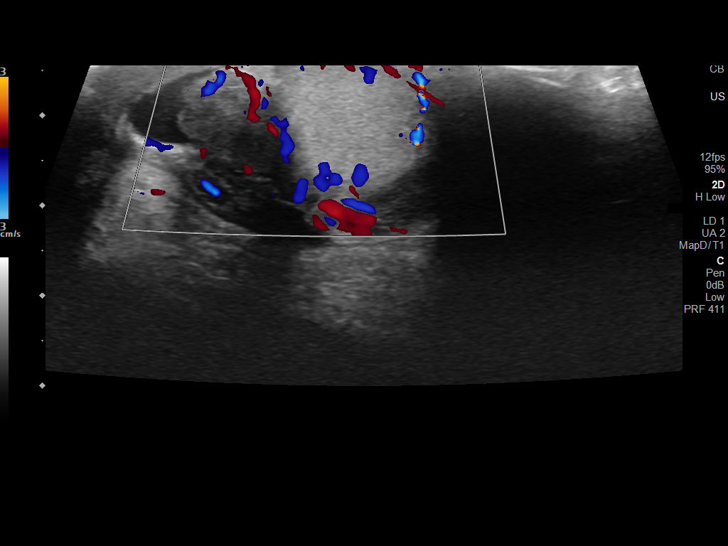
[im 83/100]
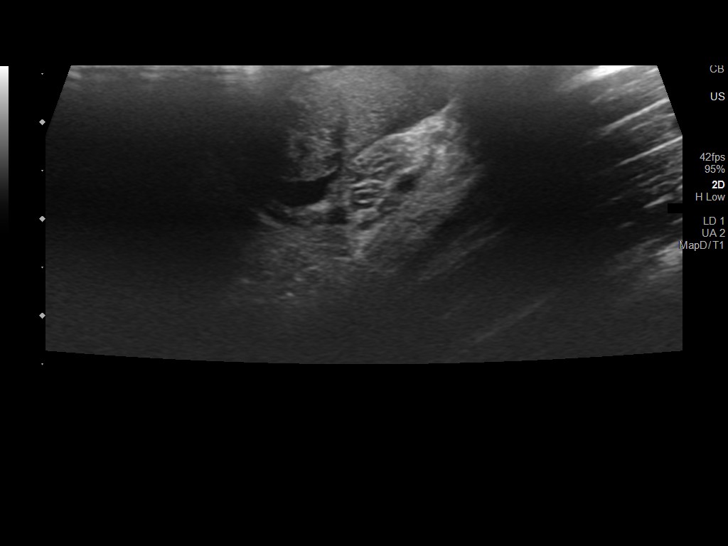
[im 91/100]
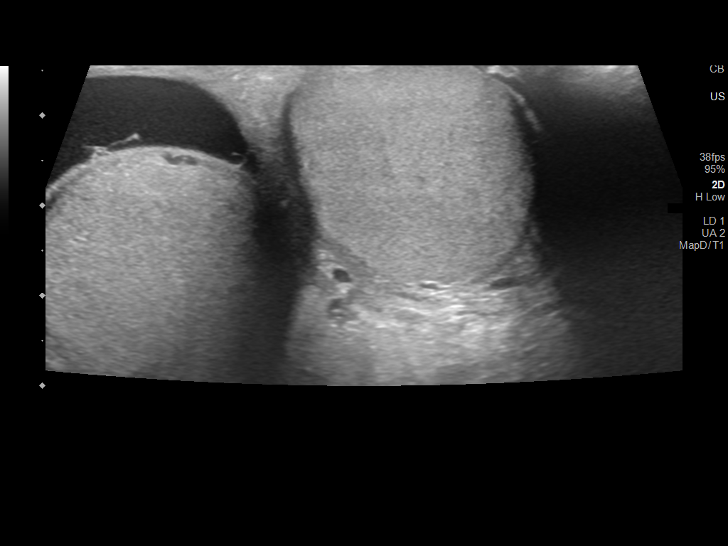
[im 100/100]
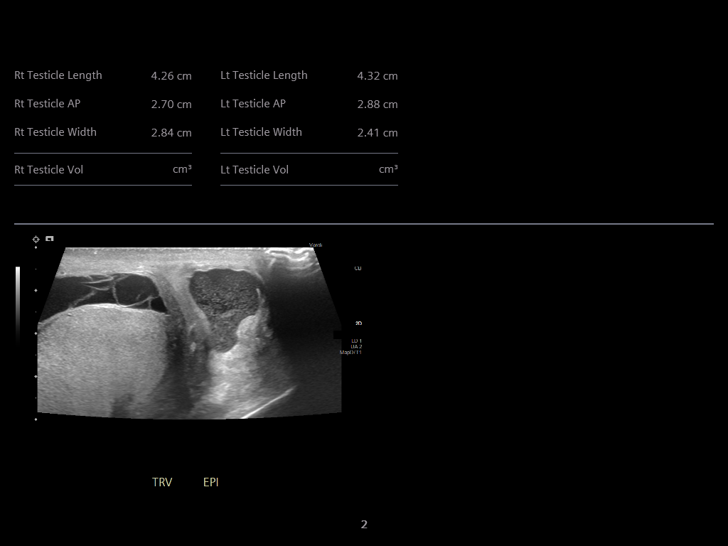

[13 of 25 positions shown; findings below may reference images not displayed]

FINDINGS: Right testicle

Measurements: 4.3 x 2.7 x 2.8 cm. Hyperemic right testicle. No mass
or microlithiasis.

Left testicle

Measurements: 4.3 x 2.9 x 2.4 cm. No mass or microlithiasis
visualized.

Right epididymis:  Enlarged and hyperemic.

Left epididymis:  Normal in size and appearance.

Hydrocele: Large complex septated right hydrocele. Small to moderate
complex left hydrocele.

Varicocele:  None visualized.

Pulsed Doppler interrogation of both testes demonstrates normal low
resistance arterial and venous waveforms bilaterally.

Marked scrotal wall thickening on the right.
IMPRESSION: Hyperemic right testicle. Hyperemic and enlarged right epididymis.
Findings compatible with epididymo-orchitis.

Complex bilateral hydroceles, right larger than left.

Scrotal wall thickening/edema.

No evidence of testicular torsion.
